# Patient Record
Sex: Male | Born: 1985 | Race: Black or African American | Hispanic: No
Health system: Southern US, Community
[De-identification: ages and names within clinical notes are randomized; demographics above are authoritative.]

## PROBLEM LIST (undated history)

## (undated) HISTORY — PX: APPENDECTOMY: SHX54

## (undated) HISTORY — PX: LYMPH NODE BIOPSY: SHX201

---
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
## 2007-03-28 ENCOUNTER — Emergency Department: Payer: Self-pay | Admitting: Emergency Medicine

## 2007-10-11 ENCOUNTER — Emergency Department: Payer: Self-pay | Admitting: Emergency Medicine

## 2007-10-13 ENCOUNTER — Emergency Department: Payer: Self-pay | Admitting: Internal Medicine

## 2007-12-20 ENCOUNTER — Emergency Department: Payer: Self-pay | Admitting: Emergency Medicine

## 2008-07-08 ENCOUNTER — Emergency Department: Payer: Self-pay | Admitting: Emergency Medicine

## 2008-08-26 ENCOUNTER — Emergency Department: Payer: Self-pay | Admitting: Emergency Medicine

## 2008-09-17 ENCOUNTER — Emergency Department: Payer: Self-pay | Admitting: Emergency Medicine

## 2008-10-18 ENCOUNTER — Emergency Department: Payer: Self-pay | Admitting: Emergency Medicine

## 2009-02-24 ENCOUNTER — Emergency Department: Payer: Self-pay | Admitting: Emergency Medicine

## 2009-02-24 ENCOUNTER — Emergency Department: Payer: Self-pay | Admitting: Unknown Physician Specialty

## 2009-02-27 ENCOUNTER — Emergency Department: Payer: Self-pay | Admitting: Unknown Physician Specialty

## 2009-06-13 ENCOUNTER — Emergency Department: Payer: Self-pay | Admitting: Emergency Medicine

## 2009-10-12 ENCOUNTER — Emergency Department: Payer: Self-pay | Admitting: Emergency Medicine

## 2009-10-14 ENCOUNTER — Emergency Department: Payer: Self-pay | Admitting: Emergency Medicine

## 2009-12-16 ENCOUNTER — Emergency Department: Payer: Self-pay | Admitting: Emergency Medicine

## 2010-03-12 ENCOUNTER — Emergency Department: Payer: Self-pay | Admitting: Emergency Medicine

## 2010-04-19 ENCOUNTER — Emergency Department: Payer: Self-pay | Admitting: Internal Medicine

## 2010-04-21 ENCOUNTER — Emergency Department: Payer: Self-pay | Admitting: Emergency Medicine

## 2010-05-11 ENCOUNTER — Emergency Department: Payer: Self-pay | Admitting: Emergency Medicine

## 2010-05-13 ENCOUNTER — Emergency Department: Payer: Self-pay | Admitting: Emergency Medicine

## 2011-02-27 ENCOUNTER — Emergency Department: Payer: Self-pay | Admitting: Emergency Medicine

## 2011-10-07 ENCOUNTER — Emergency Department: Payer: Self-pay | Admitting: Emergency Medicine

## 2011-12-14 ENCOUNTER — Emergency Department: Payer: Self-pay | Admitting: Emergency Medicine

## 2011-12-14 LAB — BASIC METABOLIC PANEL
BUN: 10 mg/dL (ref 7–18)
Calcium, Total: 9.4 mg/dL (ref 8.5–10.1)
Chloride: 105 mmol/L (ref 98–107)
Co2: 28 mmol/L (ref 21–32)
EGFR (Non-African Amer.): >60
Osmolality: 279 (ref 275–301)
Potassium: 3.1 mmol/L — ABNORMAL LOW (ref 3.5–5.1)
Sodium: 140 mmol/L (ref 136–145)

## 2011-12-14 LAB — CBC
HGB: 12.6 g/dL — ABNORMAL LOW (ref 13.0–18.0)
MCH: 30 pg (ref 26.0–34.0)
MCHC: 32.7 g/dL (ref 32.0–36.0)
Platelet: 245 10*3/uL (ref 150–440)
RDW: 13.2 % (ref 11.5–14.5)

## 2011-12-14 LAB — URINALYSIS, COMPLETE
Nitrite: NEGATIVE
Ph: 6 (ref 4.5–8.0)
Protein: 100
RBC,UR: 1762 /HPF (ref 0–5)
Specific Gravity: 1.02 (ref 1.003–1.030)

## 2012-01-07 ENCOUNTER — Emergency Department: Payer: Self-pay | Admitting: Internal Medicine

## 2013-09-27 ENCOUNTER — Emergency Department: Payer: Self-pay | Admitting: Emergency Medicine

## 2013-09-27 LAB — COMPREHENSIVE METABOLIC PANEL
ALT: 19 U/L
AST: 29 U/L (ref 15–37)
Albumin: 4.4 g/dL (ref 3.4–5.0)
Alkaline Phosphatase: 50 U/L
Anion Gap: 12 (ref 7–16)
BUN: 15 mg/dL (ref 7–18)
Bilirubin,Total: 0.4 mg/dL (ref 0.2–1.0)
CALCIUM: 9.4 mg/dL (ref 8.5–10.1)
CHLORIDE: 106 mmol/L (ref 98–107)
Co2: 24 mmol/L (ref 21–32)
Creatinine: 0.99 mg/dL (ref 0.60–1.30)
EGFR (African American): >60
GLUCOSE: 112 mg/dL — AB (ref 65–99)
Osmolality: 285 (ref 275–301)
Potassium: 3 mmol/L — ABNORMAL LOW (ref 3.5–5.1)
SODIUM: 142 mmol/L (ref 136–145)
Total Protein: 8 g/dL (ref 6.4–8.2)

## 2013-09-27 LAB — CBC WITH DIFFERENTIAL/PLATELET
BASOS ABS: 0.1 10*3/uL (ref 0.0–0.1)
BASOS PCT: 0.5 %
Eosinophil #: 0 10*3/uL (ref 0.0–0.7)
Eosinophil %: 0.2 %
HCT: 38.9 % — AB (ref 40.0–52.0)
HGB: 12.6 g/dL — ABNORMAL LOW (ref 13.0–18.0)
LYMPHS PCT: 8.1 %
Lymphocyte #: 1.6 10*3/uL (ref 1.0–3.6)
MCH: 29.4 pg (ref 26.0–34.0)
MCHC: 32.4 g/dL (ref 32.0–36.0)
MCV: 91 fL (ref 80–100)
Monocyte #: 1.2 x10 3/mm — ABNORMAL HIGH (ref 0.2–1.0)
Monocyte %: 5.9 %
Neutrophil #: 17 10*3/uL — ABNORMAL HIGH (ref 1.4–6.5)
Neutrophil %: 85.3 %
Platelet: 242 10*3/uL (ref 150–440)
RBC: 4.29 10*6/uL — ABNORMAL LOW (ref 4.40–5.90)
RDW: 13.2 % (ref 11.5–14.5)
WBC: 19.9 10*3/uL — AB (ref 3.8–10.6)

## 2013-09-27 LAB — URINALYSIS, COMPLETE
Bilirubin,UR: NEGATIVE
Blood: NEGATIVE
Glucose,UR: NEGATIVE mg/dL (ref 0–75)
LEUKOCYTE ESTERASE: NEGATIVE
Nitrite: NEGATIVE
PH: 8 (ref 4.5–8.0)
Protein: NEGATIVE
SPECIFIC GRAVITY: 1.018 (ref 1.003–1.030)
Squamous Epithelial: NONE SEEN
WBC UR: 2 /HPF (ref 0–5)

## 2013-09-27 LAB — LIPASE, BLOOD: Lipase: 100 U/L (ref 73–393)

## 2014-05-21 ENCOUNTER — Encounter: Payer: Self-pay | Admitting: *Deleted

## 2014-05-21 ENCOUNTER — Emergency Department
Admission: EM | Admit: 2014-05-21 | Discharge: 2014-05-21 | Disposition: A | Discharge status: Home / Self Care | Payer: Self-pay | Attending: Emergency Medicine | Admitting: Emergency Medicine

## 2014-05-21 ENCOUNTER — Emergency Department: Payer: Self-pay

## 2014-05-21 DIAGNOSIS — W2209XA Striking against other stationary object, initial encounter: Secondary | ICD-10-CM | POA: Insufficient documentation

## 2014-05-21 DIAGNOSIS — Y9289 Other specified places as the place of occurrence of the external cause: Secondary | ICD-10-CM | POA: Insufficient documentation

## 2014-05-21 DIAGNOSIS — Y998 Other external cause status: Secondary | ICD-10-CM | POA: Insufficient documentation

## 2014-05-21 DIAGNOSIS — Y9389 Activity, other specified: Secondary | ICD-10-CM | POA: Insufficient documentation

## 2014-05-21 DIAGNOSIS — S61412A Laceration without foreign body of left hand, initial encounter: Secondary | ICD-10-CM | POA: Insufficient documentation

## 2014-05-21 NOTE — ED Notes (Signed)
Pt reports he slammed his right hand in the door tonight. Pt with laceration/abrasions to left hand knuckles.

## 2014-05-21 NOTE — ED Notes (Signed)
Pt called for room x1. No answer.  

## 2015-03-10 ENCOUNTER — Encounter: Payer: Self-pay | Admitting: Emergency Medicine

## 2015-03-10 ENCOUNTER — Emergency Department
Admission: EM | Admit: 2015-03-10 | Discharge: 2015-03-10 | Disposition: A | Discharge status: Home / Self Care | Payer: Self-pay | Attending: Emergency Medicine | Admitting: Emergency Medicine

## 2015-03-10 DIAGNOSIS — F17210 Nicotine dependence, cigarettes, uncomplicated: Secondary | ICD-10-CM | POA: Insufficient documentation

## 2015-03-10 DIAGNOSIS — L729 Follicular cyst of the skin and subcutaneous tissue, unspecified: Secondary | ICD-10-CM | POA: Insufficient documentation

## 2015-03-10 DIAGNOSIS — M7989 Other specified soft tissue disorders: Secondary | ICD-10-CM

## 2015-03-10 MED ORDER — OXYCODONE-ACETAMINOPHEN 7.5-325 MG PO TABS: 1.0000 | ORAL_TABLET | ORAL | Status: DC | PRN

## 2015-03-10 MED ORDER — CEPHALEXIN 500 MG PO CAPS: 500.0000 mg | ORAL_CAPSULE | Freq: Four times a day (QID) | ORAL | Status: DC

## 2015-03-10 MED ORDER — LIDOCAINE HCL (PF) 1 % IJ SOLN
INTRAMUSCULAR | Status: AC
  Filled 2015-03-10: qty 5

## 2015-03-10 NOTE — Discharge Instructions (Signed)
Medication as directed and return in 2 days for reevaluation.

## 2015-03-10 NOTE — ED Notes (Signed)
States he has 2 possible abscess areas under left arm  Also concerned about breakout on face

## 2015-03-10 NOTE — ED Provider Notes (Signed)
[facility] Emergency Department Provider Note  ____________________________________________  Time seen: Approximately 7:27 AM  I have reviewed the triage vital signs and the nursing notes.   HISTORY  Chief Complaint Abscess    HPI Jerome Bates is a 30 y.o. male presents with possible abscess. He has a history abscesses since he was 30 years old and has had treatment for them in the past including incision and drainage and antibiotics. His current abscess developed 3-4 days ago and is located in the left axillary region. He had another abscess in the same region appear 2 weeks ago and 2 on his face 1-2 days ago, but these are resolving. The patient also complains of a possible abscess on the right buttock for the past year, which is not painful. His current axillary abscess is painful, which he describes as a knot an throbbing. Pain is exacerbated by movement. He ranks the severity a 5-6/10 and denies radiation of pain. The patient used Prid with his first axillary abscess, which he believes helped. He has been using Boil Ease for the current abscess without improvement. The patient has associated acne but otherwise denies associated symptoms. He denies fever, chills, nausea, and vomiting.   History reviewed. No pertinent past medical history.  There are no active problems to display for this patient.   Past Surgical History  Procedure Laterality Date  . Appendectomy    . Lymph node biopsy      Current Outpatient Rx  Name  Route  Sig  Dispense  Refill  . cephALEXin (KEFLEX) 500 MG capsule   Oral   Take 1 capsule (500 mg total) by mouth 4 (four) times daily.   40 capsule   0   . oxyCODONE-acetaminophen (PERCOCET) 7.5-325 MG tablet   Oral   Take 1 tablet by mouth every 4 (four) hours as needed for severe pain.   20 tablet   0     Allergies Sulfa antibiotics  No family history on file.  Social History Social History  Substance Use Topics   . Smoking status: Current Every Day Smoker    Types: Cigarettes  . Smokeless tobacco: None  . Alcohol Use: No    Review of Systems Constitutional: No fever/chills Gastrointestinal:  No nausea, no vomiting.   Genitourinary: Negative for dysuria. Musculoskeletal: Negative for back pain. Skin: Negative for rash. Positive for acne and abscess. Neurological: Negative for headaches, focal weakness or numbness. Hematological/Lymphatic: 10-point ROS otherwise negative.  ____________________________________________   PHYSICAL EXAM:  VITAL SIGNS: ED Triage Vitals  Enc Vitals Group     BP 03/10/15 0629 111/70 mmHg     Pulse Rate 03/10/15 0629 64     Resp 03/10/15 0629 20     Temp 03/10/15 0629 98 F (36.7 C)     Temp Source 03/10/15 0629 Oral     SpO2 --      Weight 03/10/15 0629 130 lb (58.968 kg)     Height 03/10/15 0629  (1.753 m)     Head Cir --      Peak Flow --      Pain Score 03/10/15 0629 8     Pain Loc --      Pain Edu? --      Excl. in GC? --     Constitutional: Alert and oriented. Well appearing and in no acute distress. Head: Atraumatic. Musculoskeletal: No upper extremity tenderness nor edema.   Neurologic:  Normal speech and language. No gross focal neurologic  deficits are appreciated. No gait instability. Skin:  Skin is warm, dry and intact.  2-3 cm induration on left axillary is erythematous, tender with fluctuance. 0.5 cm induration on left axillary is erythematous without fluctuance. 2 cm induration on right buttock is non-erythematous, non-tender with fluctuance. Psychiatric: Mood and affect are normal. Speech and behavior are normal.  ____________________________________________   LABS (all labs ordered are listed, but only abnormal results are displayed)  Labs Reviewed - No data to  display ____________________________________________  EKG   ____________________________________________  RADIOLOGY   ____________________________________________   PROCEDURES  Procedure(s) performed: The procedure note  Critical Care performed: No  _____________________INCISION AND DRAINAGE Performed by: Joni Reining Consent: Verbal consent obtained. Risks and benefits: risks, benefits and alternatives were discussed Type: cyst  Body area: left axillary  Anesthesia: local infiltration  Incision was made with a scalpel.  Local anesthetic: lidocaine 1% without epinephrine  Anesthetic total: 5 ml  Complexity: complex Blunt dissection to break up loculations  Drainage: purulent  Drainage amount: minimal  Packing material: 5cm iodoform gauze  Patient tolerance: Patient tolerated the procedure well with no immediate complications.   _______________________   INITIAL IMPRESSION / ASSESSMENT AND PLAN / ED COURSE  Pertinent labs & imaging results that were available during my care of the patient were reviewed by me and considered in my medical decision making (see chart for details).  Granulated Cyst ____________________________________________   FINAL CLINICAL IMPRESSION(S) / ED DIAGNOSES  Final diagnoses:  Cyst of soft tissue      Joni Reining, PA-C 03/10/15 4098  Richardean Canal, MD 03/10/15 [phone]

## 2015-03-10 NOTE — ED Notes (Signed)
Pt presents to ED with c/o 2 possible abscesses under his left arm in adition to some possibly on his face. Pt sates he was been trying to self treat the areas with otc hydrocortisone cream with no relief. Pt also reports he has been getting these same boils "back to back" on his face. Denies fever at home.

## 2015-03-12 ENCOUNTER — Encounter: Payer: Self-pay | Admitting: Emergency Medicine

## 2015-03-12 ENCOUNTER — Emergency Department
Admission: EM | Admit: 2015-03-12 | Discharge: 2015-03-12 | Disposition: A | Discharge status: Home / Self Care | Payer: Self-pay | Attending: Emergency Medicine | Admitting: Emergency Medicine

## 2015-03-12 DIAGNOSIS — F17210 Nicotine dependence, cigarettes, uncomplicated: Secondary | ICD-10-CM | POA: Insufficient documentation

## 2015-03-12 DIAGNOSIS — Z5189 Encounter for other specified aftercare: Secondary | ICD-10-CM

## 2015-03-12 DIAGNOSIS — Z792 Long term (current) use of antibiotics: Secondary | ICD-10-CM | POA: Insufficient documentation

## 2015-03-12 DIAGNOSIS — Z4801 Encounter for change or removal of surgical wound dressing: Secondary | ICD-10-CM | POA: Insufficient documentation

## 2015-03-12 MED ORDER — TRAMADOL HCL 50 MG PO TABS: 50.0000 mg | ORAL_TABLET | Freq: Four times a day (QID) | ORAL | Status: DC | PRN

## 2015-03-12 NOTE — ED Notes (Signed)
See triage noted   Pt is undressed and awating PA

## 2015-03-12 NOTE — ED Provider Notes (Signed)
[facility] Emergency Department Provider Note  ____________________________________________  Time seen: Approximately 8:21 AM  I have reviewed the triage vital signs and the nursing notes.   HISTORY  Chief Complaint Wound Check    HPI Jerome Bates is a 30 y.o. male who presents for follow-up for a wound check. The patient presents two days ago for a possible abscess. He underwent incision and drainage of a left axillary cyst. He is doing well and pain is well managed on percocet. He ranks his pain to be a 2/10. He is also taking keflex without problems. The patient denies fever, chills, nausea and vomiting.  History reviewed. No pertinent past medical history.  There are no active problems to display for this patient.   Past Surgical History  Procedure Laterality Date  . Appendectomy    . Lymph node biopsy      Current Outpatient Rx  Name  Route  Sig  Dispense  Refill  . cephALEXin (KEFLEX) 500 MG capsule   Oral   Take 1 capsule (500 mg total) by mouth 4 (four) times daily.   40 capsule   0   . oxyCODONE-acetaminophen (PERCOCET) 7.5-325 MG tablet   Oral   Take 1 tablet by mouth every 4 (four) hours as needed for severe pain.   20 tablet   0   . traMADol (ULTRAM) 50 MG tablet   Oral   Take 1 tablet (50 mg total) by mouth every 6 (six) hours as needed for moderate pain.   12 tablet   0     Allergies Sulfa antibiotics  No family history on file.  Social History Social History  Substance Use Topics  . Smoking status: Current Every Day Smoker    Types: Cigarettes  . Smokeless tobacco: None  . Alcohol Use: No    Review of Systems Constitutional: No fever/chills Gastrointestinal: No abdominal pain.  No nausea, no vomiting.  No diarrhea.   Skin: Negative for redness, irritation in axillary region Neurological: Negative for headaches, focal weakness or numbness. 10-point ROS otherwise  negative.  ____________________________________________   PHYSICAL EXAM:  VITAL SIGNS: ED Triage Vitals  Enc Vitals Group     BP 03/12/15 0817 108/52 mmHg     Pulse Rate 03/12/15 0817 78     Resp 03/12/15 0817 18     Temp 03/12/15 0817 99.3 F (37.4 C)     Temp Source 03/12/15 0817 Oral     SpO2 03/12/15 0817 98 %     Weight 03/12/15 0817 130 lb (58.968 kg)     Height 03/12/15 0818  (1.753 m)     Head Cir --      Peak Flow --      Pain Score 03/12/15 0818 2     Pain Loc --      Pain Edu? --      Excl. in GC? --    Constitutional: Alert and oriented. Well appearing and in no acute distress. Head: Atraumatic. Musculoskeletal: No upper extremity tenderness nor edema.   Neurologic:  Normal speech and language. No gross focal neurologic deficits are appreciated. No gait instability. Skin:  Site of incision and drainage appears to be healing well. Minimal drainage noted.  Psychiatric: Mood and affect are normal. Speech and behavior are normal.  ____________________________________________   LABS (all labs ordered are listed, but only abnormal results are displayed)  Labs Reviewed - No data to display ____________________________________________  EKG   ____________________________________________  RADIOLOGY  ____________________________________________   PROCEDURES  Procedure(s) performed: None  Critical Care performed: No  ____________________________________________   INITIAL IMPRESSION / ASSESSMENT AND PLAN / ED COURSE  Pertinent labs & imaging results that were available during my care of the patient were reviewed by me and considered in my medical decision making (see chart for details).  Granulated cyst wound check.patient given discharge Instructions. Patient advised continue previous medications. Patient given a work note for today. Patient advised return back here if condition worsens. ____________________________________________   FINAL  CLINICAL IMPRESSION(S) / ED DIAGNOSES  Final diagnoses:  Visit for wound check      Joni Reining, PA-C 03/12/15 1610  Arnaldo Natal, MD 03/12/15 1536

## 2015-03-12 NOTE — ED Notes (Signed)
Here for packing removal   Had abscess lanced 2 days ago

## 2015-12-23 ENCOUNTER — Emergency Department
Admission: EM | Admit: 2015-12-23 | Discharge: 2015-12-23 | Disposition: A | Discharge status: Home / Self Care | Payer: Self-pay | Attending: Emergency Medicine | Admitting: Emergency Medicine

## 2015-12-23 ENCOUNTER — Encounter: Payer: Self-pay | Admitting: Emergency Medicine

## 2015-12-23 DIAGNOSIS — L729 Follicular cyst of the skin and subcutaneous tissue, unspecified: Secondary | ICD-10-CM

## 2015-12-23 DIAGNOSIS — L0231 Cutaneous abscess of buttock: Secondary | ICD-10-CM | POA: Insufficient documentation

## 2015-12-23 DIAGNOSIS — F17210 Nicotine dependence, cigarettes, uncomplicated: Secondary | ICD-10-CM | POA: Insufficient documentation

## 2015-12-23 NOTE — ED Notes (Signed)
Pt reports that he has had abscess to his right buttocks for approximately a year.  Pt states that there are no changes to area, no increased pain, and no fever/aches/chills.  Pt reports area has felt and looked the same for the past year.  Pt sleeping intermittently during assessment and in NAD at this time.

## 2015-12-23 NOTE — ED Triage Notes (Signed)
Pt ambulatory to triage with steady gait with c/o abscess to right gluteal region, states abscess has been getting bigger and worse tonight.

## 2015-12-23 NOTE — ED Provider Notes (Signed)
[facility] Emergency Department Provider Note  ____________________________________________   I have reviewed the triage vital signs and the nursing notes.   HISTORY  Chief Complaint Abscess    HPI Jerome Bates is a 30 y.o. male presents today complaining of a cystic structure on his right buttock which has been there for over year. Is nontender and is not draining, but he just wants it "checked out". No other complaints.      History reviewed. No pertinent past medical history.  There are no active problems to display for this patient.   Past Surgical History:  Procedure Laterality Date  . APPENDECTOMY    . LYMPH NODE BIOPSY      Prior to Admission medications   Medication Sig Start Date End Date Taking? Authorizing Provider  cephALEXin (KEFLEX) 500 MG capsule Take 1 capsule (500 mg total) by mouth 4 (four) times daily. 03/10/15   Joni Reiningonald K Smith, PA-C  oxyCODONE-acetaminophen (PERCOCET) 7.5-325 MG tablet Take 1 tablet by mouth every 4 (four) hours as needed for severe pain. 03/10/15   Joni Reiningonald K Smith, PA-C  traMADol (ULTRAM) 50 MG tablet Take 1 tablet (50 mg total) by mouth every 6 (six) hours as needed for moderate pain. 03/12/15   Joni Reiningonald K Smith, PA-C    Allergies Sulfa antibiotics  No family history on file.  Social History Social History  Substance Use Topics  . Smoking status: Current Every Day Smoker    Packs/day: 0.50    Types: Cigarettes  . Smokeless tobacco: Never Used  . Alcohol use Yes     Comment: occas    Review of Systems   ____________________________________________   PHYSICAL EXAM:  VITAL SIGNS: ED Triage Vitals  Enc Vitals Group     BP 12/23/15 0608 131/70     Pulse Rate 12/23/15 0608 72     Resp 12/23/15 0608 18     Temp 12/23/15 0608 97.6 F (36.4 C)     Temp Source 12/23/15 0608 Oral     SpO2 12/23/15 0608 97 %     Weight 12/23/15 0608 135 lb (61.2 kg)     Height 12/23/15 0608 5\' 9"  (1.753 m)      Head Circumference --      Peak Flow --      Pain Score 12/23/15 0650 5     Pain Loc --      Pain Edu? --      Excl. in GC? --     Constitutional: She was sleeping in the room but when I verbally woke him up he was awake and alert and oriented. Well appearing and in no acute distress.  Skin:  There is a small area approximately 2.5 x 2.5 cm of nontender fluctuance to the right buttocks, raised and is very well circumscribed with no cellulitic changes no warmth to touch Psychiatric: Mood and affect are normal. Speech and behavior are normal.  ____________________________________________   LABS (all labs ordered are listed, but only abnormal results are displayed)  Labs Reviewed - No data to display ____________________________________________  EKG  I personally interpreted any EKGs ordered by me or triage  ____________________________________________  RADIOLOGY  I reviewed any imaging ordered by me or triage that were performed during my shift and, if possible, patient and/or family made aware of any abnormal findings. ____________________________________________   PROCEDURES  Procedure(s) performed: None  Procedures  Critical Care performed: None  ____________________________________________   INITIAL IMPRESSION / ASSESSMENT AND PLAN / ED COURSE  Pertinent labs & imaging results that were available during my care of the patient were reviewed by me and considered in my medical decision making (see chart for details).  Patient with a cystic structure on his buttock. It is not infected at this time clinically. We will refer him to dermatology for excision if it is bothering him. Return precautions and follow-up given and understood. It has been there for well over a year and there is no evidence of active pathology today  Clinical Course    ____________________________________________   FINAL CLINICAL IMPRESSION(S) / ED DIAGNOSES  Final diagnoses:  None       This chart was dictated using voice recognition software.  Despite best efforts to proofread,  errors can occur which can change meaning.      Jeanmarie PlantJames A Aslyn Cottman, MD 12/23/15 [phone]

## 2015-12-23 NOTE — ED Notes (Signed)
Upon going in for discharge, found room to be empty.  Seems pt has left prior to getting last set of vitals and signing for discharge paper work.  Pt to follow up with dermatology per paper work; no new medications for this pt.

## 2017-12-17 ENCOUNTER — Emergency Department
Admission: EM | Admit: 2017-12-17 | Discharge: 2017-12-17 | Disposition: A | Discharge status: Home / Self Care | Payer: Self-pay | Attending: Emergency Medicine | Admitting: Emergency Medicine

## 2017-12-17 ENCOUNTER — Encounter: Payer: Self-pay | Admitting: Emergency Medicine

## 2017-12-17 DIAGNOSIS — R1084 Generalized abdominal pain: Secondary | ICD-10-CM | POA: Insufficient documentation

## 2017-12-17 DIAGNOSIS — R112 Nausea with vomiting, unspecified: Secondary | ICD-10-CM | POA: Insufficient documentation

## 2017-12-17 DIAGNOSIS — F17210 Nicotine dependence, cigarettes, uncomplicated: Secondary | ICD-10-CM | POA: Insufficient documentation

## 2017-12-17 LAB — URINALYSIS, COMPLETE (UACMP) WITH MICROSCOPIC
Bilirubin Urine: NEGATIVE
GLUCOSE, UA: NEGATIVE mg/dL
Hgb urine dipstick: NEGATIVE
Ketones, ur: 5 mg/dL — AB
Leukocytes, UA: NEGATIVE
NITRITE: NEGATIVE
Protein, ur: 30 mg/dL — AB
Specific Gravity, Urine: 1.025 (ref 1.005–1.030)
Squamous Epithelial / LPF: NONE SEEN (ref 0–5)
pH: 6 (ref 5.0–8.0)

## 2017-12-17 LAB — CBC
HCT: 39.5 % (ref 39.0–52.0)
Hemoglobin: 12.8 g/dL — ABNORMAL LOW (ref 13.0–17.0)
MCH: 29.7 pg (ref 26.0–34.0)
MCHC: 32.4 g/dL (ref 30.0–36.0)
MCV: 91.6 fL (ref 80.0–100.0)
Platelets: 315 10*3/uL (ref 150–400)
RBC: 4.31 MIL/uL (ref 4.22–5.81)
RDW: 12.9 % (ref 11.5–15.5)
WBC: 19.3 10*3/uL — ABNORMAL HIGH (ref 4.0–10.5)
nRBC: 0 % (ref 0.0–0.2)

## 2017-12-17 LAB — LIPASE, BLOOD: Lipase: 29 U/L (ref 11–51)

## 2017-12-17 LAB — COMPREHENSIVE METABOLIC PANEL
ALK PHOS: 41 U/L (ref 38–126)
ALT: 13 U/L (ref 0–44)
AST: 23 U/L (ref 15–41)
Albumin: 5 g/dL (ref 3.5–5.0)
Anion gap: 7 (ref 5–15)
BILIRUBIN TOTAL: 0.4 mg/dL (ref 0.3–1.2)
BUN: 16 mg/dL (ref 6–20)
CALCIUM: 9.4 mg/dL (ref 8.9–10.3)
CO2: 30 mmol/L (ref 22–32)
Chloride: 102 mmol/L (ref 98–111)
Creatinine, Ser: 0.72 mg/dL (ref 0.61–1.24)
GFR calc Af Amer: >60 mL/min (ref >60)
GFR calc non Af Amer: >60 mL/min (ref >60)
Glucose, Bld: 105 mg/dL — ABNORMAL HIGH (ref 70–99)
Potassium: 3.5 mmol/L (ref 3.5–5.1)
SODIUM: 139 mmol/L (ref 135–145)
Total Protein: 7.9 g/dL (ref 6.5–8.1)

## 2017-12-17 MED ORDER — ONDANSETRON 4 MG PO TBDP: 4.0000 mg | ORAL_TABLET | Freq: Three times a day (TID) | ORAL | 0 refills | Status: DC | PRN

## 2017-12-17 MED ORDER — ONDANSETRON 4 MG PO TBDP
8.0000 mg | ORAL_TABLET | Freq: Once | ORAL | Status: AC
  Administered 2017-12-17: 8 mg via ORAL
  Filled 2017-12-17: qty 2

## 2017-12-17 NOTE — ED Triage Notes (Signed)
Patient with complaint of nausea and vomiting that started about 3 hours ago.

## 2017-12-17 NOTE — ED Provider Notes (Signed)
[facility] Emergency Department Provider Note  ____________________________________________   First MD Initiated Contact with Patient 12/17/17 [phone]     (approximate)  I have reviewed the triage vital signs and the nursing notes.   HISTORY  Chief Complaint Emesis   HPI Jerome Bates is a 32 y.o. male who self presents to the emergency department with sudden onset moderate severity nausea and vomiting that began 3 hours ago acutely.  Associated with cramping upper abdominal pain.  The pain is nonradiating.  The pain is worse when vomiting and improved when not.  No diarrhea.  No fevers or chills.  He does have a remote appendectomy.  Symptoms came on suddenly after eating [employer] for dinner.  They have slowly improved after getting antiemetics.    History reviewed. No pertinent past medical history.  There are no active problems to display for this patient.   Past Surgical History:  Procedure Laterality Date  . APPENDECTOMY    . LYMPH NODE BIOPSY      Prior to Admission medications   Medication Sig Start Date End Date Taking? Authorizing Provider  cephALEXin (KEFLEX) 500 MG capsule Take 1 capsule (500 mg total) by mouth 4 (four) times daily. 03/10/15   Joni ReiningSmith, Ronald K, PA-C  ondansetron (ZOFRAN ODT) 4 MG disintegrating tablet Take 1 tablet (4 mg total) by mouth every 8 (eight) hours as needed for nausea or vomiting. 12/17/17   Merrily Brittleifenbark, Morgen Ritacco, MD  oxyCODONE-acetaminophen (PERCOCET) 7.5-325 MG tablet Take 1 tablet by mouth every 4 (four) hours as needed for severe pain. 03/10/15   Joni ReiningSmith, Ronald K, PA-C  traMADol (ULTRAM) 50 MG tablet Take 1 tablet (50 mg total) by mouth every 6 (six) hours as needed for moderate pain. 03/12/15   Joni ReiningSmith, Ronald K, PA-C    Allergies Sulfa antibiotics  No family history on file.  Social History Social History   Tobacco Use  . Smoking status: Current Every Day Smoker    Packs/day: 0.50    Types: Cigarettes  .  Smokeless tobacco: Never Used  Substance Use Topics  . Alcohol use: Yes    Comment: occas  . Drug use: Yes    Types: Marijuana    Review of Systems Constitutional: No fever/chills Eyes: No visual changes. ENT: No sore throat. Cardiovascular: Denies chest pain. Respiratory: Denies shortness of breath. Gastrointestinal: Positive for abdominal pain.  Positive for nausea, positive for vomiting.  No diarrhea.  No constipation. Genitourinary: Negative for dysuria. Musculoskeletal: Negative for back pain. Skin: Negative for rash. Neurological: Negative for headaches, focal weakness or numbness.   ____________________________________________   PHYSICAL EXAM:  VITAL SIGNS: ED Triage Vitals  Enc Vitals Group     BP 12/17/17 0135 116/64     Pulse Rate 12/17/17 0135 74     Resp 12/17/17 0135 20     Temp 12/17/17 0135 98.1 F (36.7 C)     Temp Source 12/17/17 0135 Oral     SpO2 12/17/17 0135 100 %     Weight 12/17/17 0118 130 lb (59 kg)     Height 12/17/17 0118 5\' 9"  (1.753 m)     Head Circumference --      Peak Flow --      Pain Score --      Pain Loc --      Pain Edu? --      Excl. in GC? --     Constitutional: Alert and oriented x4 appears somewhat uncomfortable though nontoxic no diaphoresis  speaks in full clear sentences Eyes: PERRL EOMI. Head: Atraumatic. Nose: No congestion/rhinnorhea. Mouth/Throat: No trismus Neck: No stridor.   Cardiovascular: Normal rate, regular rhythm. Grossly normal heart sounds.  Good peripheral circulation. Respiratory: Normal respiratory effort.  No retractions. Lungs CTAB and moving good air Gastrointestinal: Soft mild diffuse tenderness with no rebound or guarding no peritonitis no focality and hyperactive bowel sounds Musculoskeletal: No lower extremity edema   Neurologic:  Normal speech and language. No gross focal neurologic deficits are appreciated. Skin:  Skin is warm, dry and intact. No rash noted. Psychiatric: Mood and affect are  normal. Speech and behavior are normal.    ____________________________________________   DIFFERENTIAL includes but not limited to  Food poisoning, viral gastroenteritis, bacterial gastroenteritis, ileus, bowel obstruction ____________________________________________   LABS (all labs ordered are listed, but only abnormal results are displayed)  Labs Reviewed  COMPREHENSIVE METABOLIC PANEL - Abnormal; Notable for the following components:      Result Value   Glucose, Bld 105 (*)    All other components within normal limits  CBC - Abnormal; Notable for the following components:   WBC 19.3 (*)    Hemoglobin 12.8 (*)    All other components within normal limits  URINALYSIS, COMPLETE (UACMP) WITH MICROSCOPIC - Abnormal; Notable for the following components:   Color, Urine YELLOW (*)    APPearance CLOUDY (*)    Ketones, ur 5 (*)    Protein, ur 30 (*)    Bacteria, UA RARE (*)    All other components within normal limits  LIPASE, BLOOD    Lab work reviewed by me with elevated white count likely secondary to retching and vomiting.  Urinalysis shows ketosis consistent with starvation __________________________________________  EKG   ____________________________________________  RADIOLOGY   ____________________________________________   PROCEDURES  Procedure(s) performed: no  Procedures  Critical Care performed: no  ____________________________________________   INITIAL IMPRESSION / ASSESSMENT AND PLAN / ED COURSE  Pertinent labs & imaging results that were available during my care of the patient were reviewed by me and considered in my medical decision making (see chart for details).   As part of my medical decision making, I reviewed the following data within the electronic MEDICAL RECORD NUMBER History obtained from family if available, nursing notes, old chart and ekg, as well as notes from prior ED visits.  The patient comes to the emergency department with sudden  onset moderate to severe nausea and vomiting shortly after eating fast food for dinner.  His symptoms are improved with oral antiemetics and oral fluids.  I appreciate his elevated white count however his abdominal exam is benign and I do not believe he requires advanced imaging.  His white count is likely secondary to pain.  He is adequately hydrated.  I will discharge him home with a short course of Zofran.  Strict return precautions have been given.      ____________________________________________   FINAL CLINICAL IMPRESSION(S) / ED DIAGNOSES  Final diagnoses:  Non-intractable vomiting with nausea, unspecified vomiting type      NEW MEDICATIONS STARTED DURING THIS VISIT:  Discharge Medication List as of 12/17/2017  4:01 AM    START taking these medications   Details  ondansetron (ZOFRAN ODT) 4 MG disintegrating tablet Take 1 tablet (4 mg total) by mouth every 8 (eight) hours as needed for nausea or vomiting., Starting Sun 12/17/2017, Print         Note:  This document was prepared using Dragon voice recognition software and may include  unintentional dictation errors.     Merrily Brittle, MD 12/20/17 2204

## 2017-12-17 NOTE — Discharge Instructions (Signed)
It was a pleasure to take care of you today, and thank you for coming to our emergency department.  If you have any questions or concerns before leaving please ask the nurse to grab me and I'm more than happy to go through your aftercare instructions again.  If you have any concerns once you are home that you are not improving or are in fact getting worse before you can make it to your follow-up appointment, please do not hesitate to call [phone] and come back for further evaluation.  Merrily BrittleNeil Ata Pecha, MD  Results for orders placed or performed during the hospital encounter of 12/17/17  Lipase, blood  Result Value Ref Range   Lipase 29 11 - 51 U/L  Comprehensive metabolic panel  Result Value Ref Range   Sodium 139 135 - 145 mmol/L   Potassium 3.5 3.5 - 5.1 mmol/L   Chloride 102 98 - 111 mmol/L   CO2 30 22 - 32 mmol/L   Glucose, Bld 105 (H) 70 - 99 mg/dL   BUN 16 6 - 20 mg/dL   Creatinine, Ser 0.860.72 0.61 - 1.24 mg/dL   Calcium 9.4 8.9 - 57.810.3 mg/dL   Total Protein 7.9 6.5 - 8.1 g/dL   Albumin 5.0 3.5 - 5.0 g/dL   AST 23 15 - 41 U/L   ALT 13 0 - 44 U/L   Alkaline Phosphatase 41 38 - 126 U/L   Total Bilirubin 0.4 0.3 - 1.2 mg/dL   GFR calc non Af Amer >60 >60 mL/min   GFR calc Af Amer >60 >60 mL/min   Anion gap 7 5 - 15  CBC  Result Value Ref Range   WBC 19.3 (H) 4.0 - 10.5 K/uL   RBC 4.31 4.22 - 5.81 MIL/uL   Hemoglobin 12.8 (L) 13.0 - 17.0 g/dL   HCT 46.939.5 62.939.0 - 52.852.0 %   MCV 91.6 80.0 - 100.0 fL   MCH 29.7 26.0 - 34.0 pg   MCHC 32.4 30.0 - 36.0 g/dL   RDW 41.312.9 24.411.5 - 01.015.5 %   Platelets 315 150 - 400 K/uL   nRBC 0.0 0.0 - 0.2 %  Urinalysis, Complete w Microscopic  Result Value Ref Range   Color, Urine YELLOW (A) YELLOW   APPearance CLOUDY (A) CLEAR   Specific Gravity, Urine 1.025 1.005 - 1.030   pH 6.0 5.0 - 8.0   Glucose, UA NEGATIVE NEGATIVE mg/dL   Hgb urine dipstick NEGATIVE NEGATIVE   Bilirubin Urine NEGATIVE NEGATIVE   Ketones, ur 5 (A) NEGATIVE mg/dL   Protein, ur 30  (A) NEGATIVE mg/dL   Nitrite NEGATIVE NEGATIVE   Leukocytes, UA NEGATIVE NEGATIVE   RBC / HPF 0-5 0 - 5 RBC/hpf   WBC, UA 0-5 0 - 5 WBC/hpf   Bacteria, UA RARE (A) NONE SEEN   Squamous Epithelial / LPF NONE SEEN 0 - 5   Mucus PRESENT    Amorphous Crystal PRESENT

## 2018-09-30 ENCOUNTER — Emergency Department: Payer: Self-pay

## 2018-09-30 ENCOUNTER — Other Ambulatory Visit: Payer: Self-pay

## 2018-09-30 ENCOUNTER — Encounter: Payer: Self-pay | Admitting: Emergency Medicine

## 2018-09-30 ENCOUNTER — Emergency Department
Admission: EM | Admit: 2018-09-30 | Discharge: 2018-09-30 | Disposition: A | Discharge status: Home / Self Care | Payer: Self-pay | Attending: Student in an Organized Health Care Education/Training Program | Admitting: Student in an Organized Health Care Education/Training Program

## 2018-09-30 DIAGNOSIS — Y999 Unspecified external cause status: Secondary | ICD-10-CM | POA: Insufficient documentation

## 2018-09-30 DIAGNOSIS — S62336A Displaced fracture of neck of fifth metacarpal bone, right hand, initial encounter for closed fracture: Secondary | ICD-10-CM | POA: Insufficient documentation

## 2018-09-30 DIAGNOSIS — Y9389 Activity, other specified: Secondary | ICD-10-CM | POA: Insufficient documentation

## 2018-09-30 DIAGNOSIS — W2209XA Striking against other stationary object, initial encounter: Secondary | ICD-10-CM | POA: Insufficient documentation

## 2018-09-30 DIAGNOSIS — F17210 Nicotine dependence, cigarettes, uncomplicated: Secondary | ICD-10-CM | POA: Insufficient documentation

## 2018-09-30 DIAGNOSIS — Y929 Unspecified place or not applicable: Secondary | ICD-10-CM | POA: Insufficient documentation

## 2018-09-30 MED ORDER — NAPROXEN 500 MG PO TABS: 500.0000 mg | ORAL_TABLET | Freq: Two times a day (BID) | ORAL | 0 refills | Status: AC

## 2018-09-30 MED ORDER — HYDROCODONE-ACETAMINOPHEN 5-325 MG PO TABS: 1.0000 | ORAL_TABLET | Freq: Four times a day (QID) | ORAL | 0 refills | Status: AC | PRN
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
## 2018-09-30 NOTE — Discharge Instructions (Signed)
Call make an appointment with Dr. Mack Guise who is the orthopedist on call.  His contact information and address is listed on your discharge papers.  Ice and elevate to reduce swelling and help with pain.  Leave the splint on until you are seen by the orthopedist.  Medication was sent to your pharmacy.  The Norco is the pain medication and should not be taken while you are driving or operating machinery.  The naproxen you can take twice a day with food.
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
